# Patient Record
Sex: Male | Born: 1960 | Race: Black or African American | Hispanic: No | Marital: Married | Smoking: Never smoker
Health system: Southern US, Community
[De-identification: ages and names within clinical notes are randomized; demographics above are authoritative.]

## PROBLEM LIST (undated history)

## (undated) DIAGNOSIS — I10 Essential (primary) hypertension: Secondary | ICD-10-CM

## (undated) DIAGNOSIS — E785 Hyperlipidemia, unspecified: Secondary | ICD-10-CM

## (undated) DIAGNOSIS — F329 Major depressive disorder, single episode, unspecified: Secondary | ICD-10-CM

## (undated) DIAGNOSIS — G4730 Sleep apnea, unspecified: Secondary | ICD-10-CM

## (undated) DIAGNOSIS — F32A Depression, unspecified: Secondary | ICD-10-CM

## (undated) DIAGNOSIS — D696 Thrombocytopenia, unspecified: Secondary | ICD-10-CM

## (undated) HISTORY — DX: Thrombocytopenia, unspecified: D69.6

## (undated) HISTORY — DX: Essential (primary) hypertension: I10

## (undated) HISTORY — DX: Depression, unspecified: F32.A

## (undated) HISTORY — DX: Sleep apnea, unspecified: G47.30

## (undated) HISTORY — DX: Major depressive disorder, single episode, unspecified: F32.9

## (undated) HISTORY — DX: Hyperlipidemia, unspecified: E78.5

---

## 2006-02-24 ENCOUNTER — Emergency Department (HOSPITAL_COMMUNITY): Admission: EM | Admit: 2006-02-24 | Discharge: 2006-02-24 | Payer: Self-pay | Admitting: Emergency Medicine

## 2006-02-26 ENCOUNTER — Emergency Department (HOSPITAL_COMMUNITY): Admission: EM | Admit: 2006-02-26 | Discharge: 2006-02-26 | Payer: Self-pay | Admitting: Emergency Medicine

## 2009-10-03 ENCOUNTER — Emergency Department (HOSPITAL_COMMUNITY): Admission: EM | Admit: 2009-10-03 | Discharge: 2009-10-03 | Payer: Self-pay | Admitting: Emergency Medicine

## 2009-11-05 ENCOUNTER — Encounter: Admission: RE | Admit: 2009-11-05 | Discharge: 2009-11-05 | Payer: Self-pay | Admitting: Orthopedic Surgery

## 2011-10-02 ENCOUNTER — Telehealth: Payer: Self-pay | Admitting: Oncology

## 2011-10-02 NOTE — Telephone Encounter (Signed)
lmonvm adviisng the pt of an appt available week of 10/12/2011 and for him to call me to confirm

## 2011-10-02 NOTE — Telephone Encounter (Signed)
Pt clled in to confirm that he can make his appt on 10/12/2011 with dr Gaylyn Rong

## 2011-10-05 ENCOUNTER — Encounter: Payer: Self-pay | Admitting: Oncology

## 2011-10-05 ENCOUNTER — Other Ambulatory Visit: Payer: Self-pay | Admitting: Oncology

## 2011-10-05 DIAGNOSIS — M109 Gout, unspecified: Secondary | ICD-10-CM | POA: Insufficient documentation

## 2011-10-05 DIAGNOSIS — F32A Depression, unspecified: Secondary | ICD-10-CM | POA: Insufficient documentation

## 2011-10-05 DIAGNOSIS — G4730 Sleep apnea, unspecified: Secondary | ICD-10-CM | POA: Insufficient documentation

## 2011-10-05 DIAGNOSIS — D696 Thrombocytopenia, unspecified: Secondary | ICD-10-CM

## 2011-10-05 DIAGNOSIS — E785 Hyperlipidemia, unspecified: Secondary | ICD-10-CM | POA: Insufficient documentation

## 2011-10-05 DIAGNOSIS — F329 Major depressive disorder, single episode, unspecified: Secondary | ICD-10-CM | POA: Insufficient documentation

## 2011-10-05 DIAGNOSIS — I10 Essential (primary) hypertension: Secondary | ICD-10-CM | POA: Insufficient documentation

## 2011-10-12 ENCOUNTER — Ambulatory Visit: Payer: Self-pay

## 2011-10-12 ENCOUNTER — Ambulatory Visit: Payer: Self-pay | Admitting: Oncology

## 2011-10-12 ENCOUNTER — Other Ambulatory Visit: Payer: Self-pay | Admitting: Lab

## 2022-04-11 ENCOUNTER — Emergency Department (HOSPITAL_COMMUNITY): Payer: BC Managed Care – PPO

## 2022-04-11 ENCOUNTER — Emergency Department (HOSPITAL_COMMUNITY)
Admission: EM | Admit: 2022-04-11 | Discharge: 2022-04-11 | Disposition: A | Discharge status: Home / Self Care | Payer: BC Managed Care – PPO | Attending: Emergency Medicine | Admitting: Emergency Medicine

## 2022-04-11 ENCOUNTER — Encounter (HOSPITAL_COMMUNITY): Payer: Self-pay | Admitting: Emergency Medicine

## 2022-04-11 DIAGNOSIS — S93402A Sprain of unspecified ligament of left ankle, initial encounter: Secondary | ICD-10-CM | POA: Diagnosis not present

## 2022-04-11 DIAGNOSIS — W11XXXA Fall on and from ladder, initial encounter: Secondary | ICD-10-CM | POA: Diagnosis not present

## 2022-04-11 DIAGNOSIS — W19XXXA Unspecified fall, initial encounter: Secondary | ICD-10-CM

## 2022-04-11 DIAGNOSIS — M25512 Pain in left shoulder: Secondary | ICD-10-CM | POA: Diagnosis not present

## 2022-04-11 DIAGNOSIS — S9032XA Contusion of left foot, initial encounter: Secondary | ICD-10-CM | POA: Diagnosis not present

## 2022-04-11 DIAGNOSIS — R079 Chest pain, unspecified: Secondary | ICD-10-CM | POA: Diagnosis not present

## 2022-04-11 DIAGNOSIS — I10 Essential (primary) hypertension: Secondary | ICD-10-CM | POA: Insufficient documentation

## 2022-04-11 DIAGNOSIS — S99912A Unspecified injury of left ankle, initial encounter: Secondary | ICD-10-CM | POA: Diagnosis present

## 2022-04-11 NOTE — ED Provider Notes (Signed)
[facility] EMERGENCY DEPARTMENT Provider Note   CSN: [id number] Arrival date & time: 04/11/22  2052     History  Chief Complaint  Patient presents with   [name]    Bryan Morton is a 61 y.o. male.   Fall  Patient presents after fall.  [place] off a 12 foot ladder.  Initially she states there was some pain on left shoulder chest and left lower extremity.  States now really just hurts in his left ankle and knee.  Amatory since and states that happened about 2 hours ago and he kept working at [employer].  States he landed on some traumas and a speaker landed on to him.    Past Medical History:  Diagnosis Date   Depression    Gout    Hyperlipidemia    Hypertension    Sleep apnea    Thrombocytopenia ([place])     Home Medications Prior to Admission medications   Not on File      Allergies    Patient has no known allergies.    Review of Systems   Review of Systems  Physical Exam Updated Vital Signs BP 123/80   Pulse 80   Temp 98.7 F (37.1 C)   Resp (!) 23   Ht 6\' 1"  (1.854 m)   Wt 122.5 kg   SpO2 98%   BMI 35.62 kg/m  Physical Exam Vitals reviewed.  Constitutional:      Appearance: Normal appearance.  HENT:     Head: Atraumatic.  Eyes:     Extraocular Movements: Extraocular movements intact.  Cardiovascular:     Rate and Rhythm: Regular rhythm.  Pulmonary:     Effort: Pulmonary effort is normal.  Chest:     Chest wall: No tenderness.  Abdominal:     Tenderness: There is no abdominal tenderness.  Musculoskeletal:        General: Tenderness present.     Cervical back: Neck supple.     Comments: Tenderness over left ankle laterally.  Also tenderness over left calcaneus.  No deformity.  Good range of motion in knee.  Good range of motion ankle.  Sensation intact in foot.  No hip tenderness.  No spine tenderness.  Good range of motion left shoulder.  No clavicular tenderness.  No chest wall tenderness.  Skin:    Capillary Refill: Capillary refill takes  less than 2 seconds.  Neurological:     Mental Status: He is alert and oriented to person, place, and time.     ED Results / Procedures / Treatments   Labs (all labs ordered are listed, but only abnormal results are displayed) Labs Reviewed - No data to display  EKG None  Radiology DG Foot Complete Left  Result Date: 04/11/2022 CLINICAL DATA:  Fall, left foot pain EXAM: LEFT FOOT - COMPLETE 3+ VIEW COMPARISON:  None Available. FINDINGS: There is no evidence of fracture or dislocation. Small superior and plantar calcaneal spurs. There is no evidence of arthropathy or other focal bone abnormality. Soft tissues are unremarkable. IMPRESSION: No acute fracture or dislocation. Electronically Signed   By: Fidela Salisbury M.D.   On: 04/11/2022 21:48   DG Ankle Complete Left  Result Date: 04/11/2022 CLINICAL DATA:  Fall, left ankle pain EXAM: LEFT ANKLE COMPLETE - 3+ VIEW COMPARISON:  None Available. FINDINGS: Normal alignment. No acute fracture or dislocation. Ankle mortise is aligned. No ankle effusion. Mild soft tissue swelling superficial to the lateral malleolus. Small superior and plantar calcaneal spurs. IMPRESSION:  Lateral soft tissue swelling.  No acute fracture or dislocation. Electronically Signed   By: Fidela Salisbury M.D.   On: 04/11/2022 21:47    Procedures Procedures    Medications Ordered in ED Medications - No data to display  ED Course/ Medical Decision Making/ A&P                           Medical Decision Making Amount and/or Complexity of Data Reviewed Radiology: ordered.   Patient fell.  Initially had been complaining of some left shoulder pain but it had resolved.  Doubt severe intra-abdominal intrathoracic injury.  Not on blood thinners.  Does have some left ankle and left heel tenderness.  X-rays independently interpreted showed no fracture.  Discussed with the possibility of occult fracture.  Will give ASO and follow-up as needed.  Appears stable for  discharge.        Final Clinical Impression(s) / ED Diagnoses Final diagnoses:  Fall, initial encounter  Contusion of left foot, initial encounter  Sprain of left ankle, unspecified ligament, initial encounter    Rx / DC Orders ED Discharge Orders     None         Davonna Belling, MD 04/11/22 2329

## 2022-04-11 NOTE — ED Triage Notes (Signed)
Pt fell from 95ft ladder today. Pt c/o left shoulder, left ribs, and left ankle pain. Pt denies LOC.

## 2022-06-30 IMAGING — DX DG ANKLE COMPLETE 3+V*L*
3 series · 3 of 3 positions shown · non-contrast
Comparison: None Available.

CLINICAL DATA: Fall, left ankle pain

EXAM:
LEFT ANKLE COMPLETE - 3+ VIEW

[ankle ap]
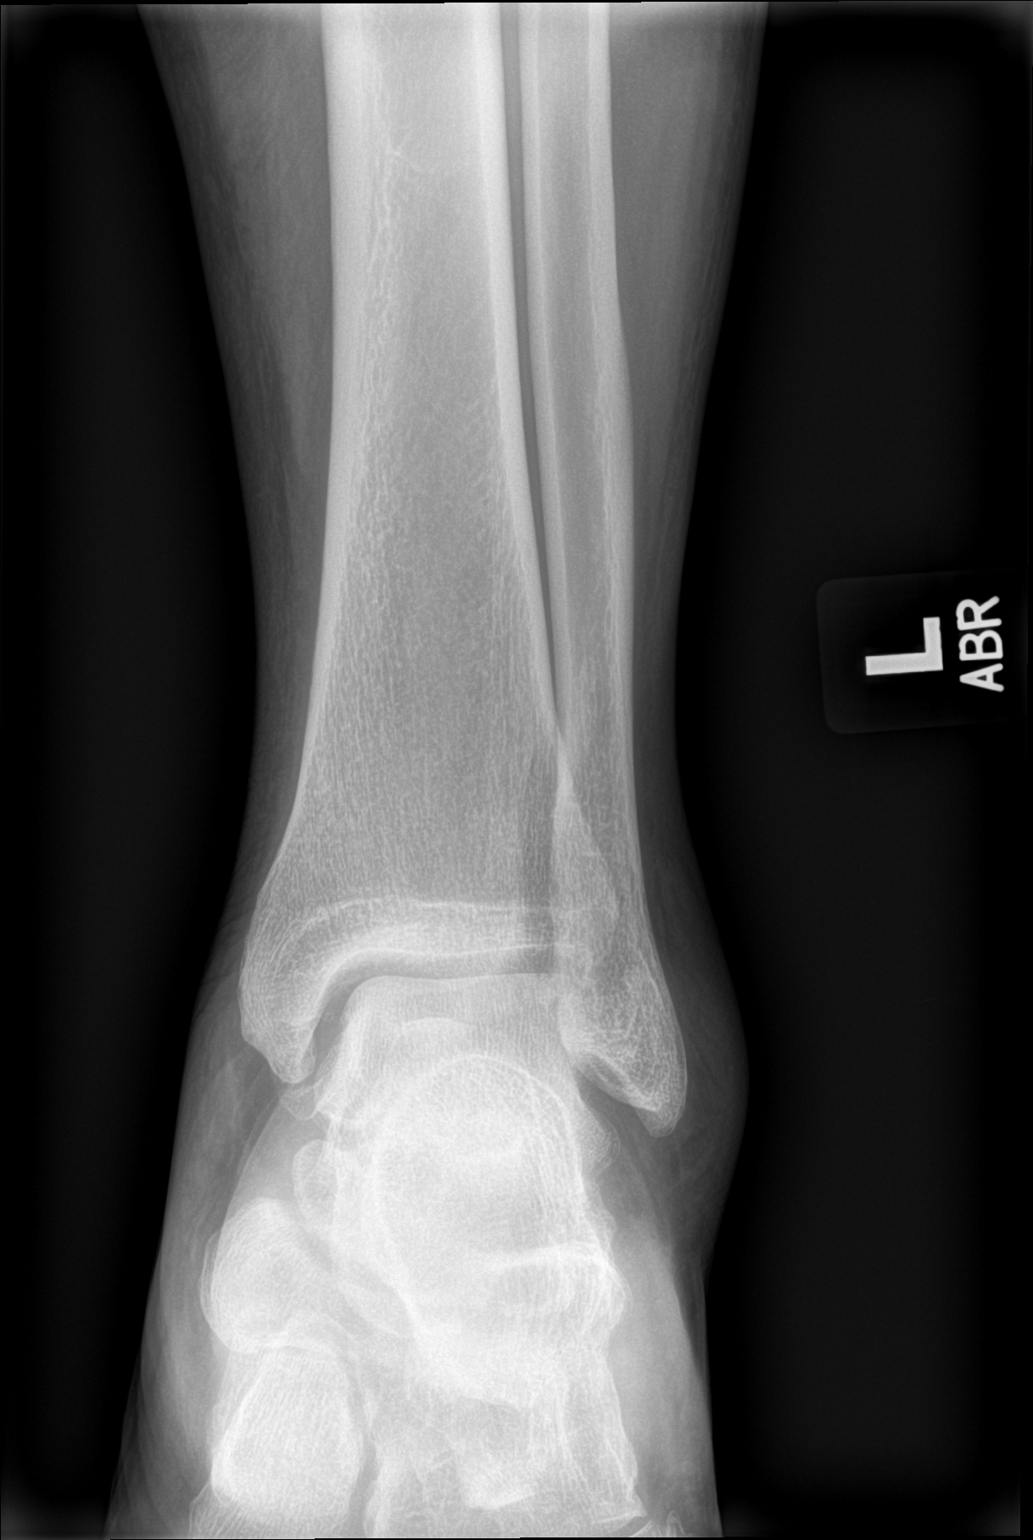

[ankle obl]
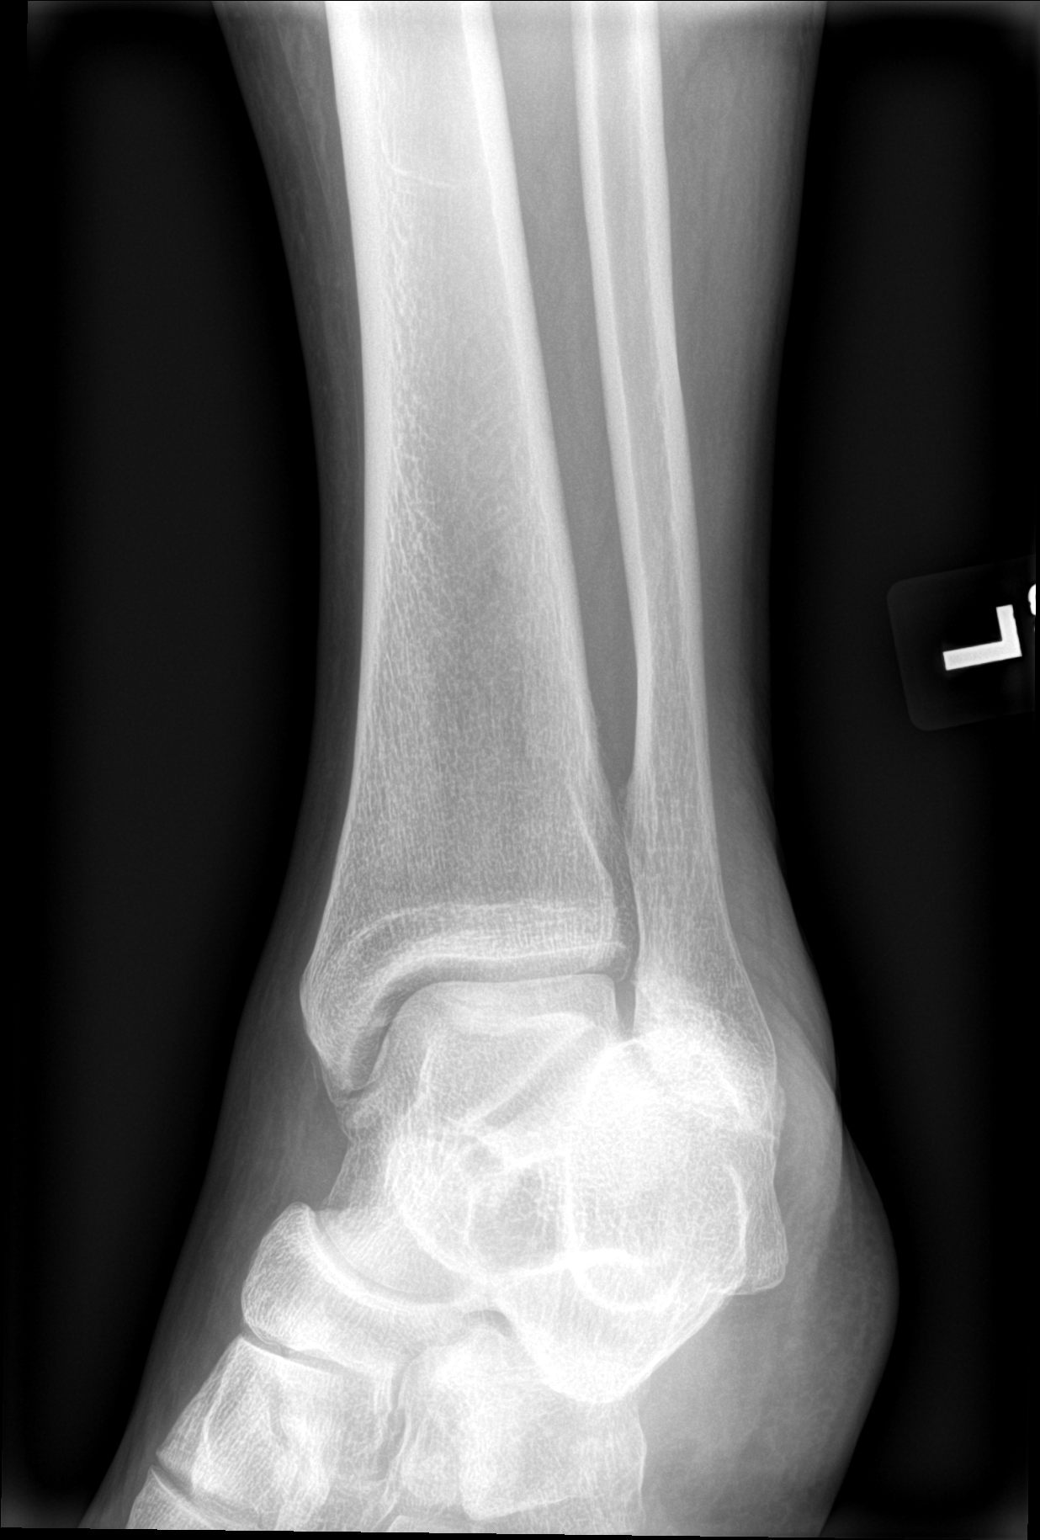

[ankle lat]
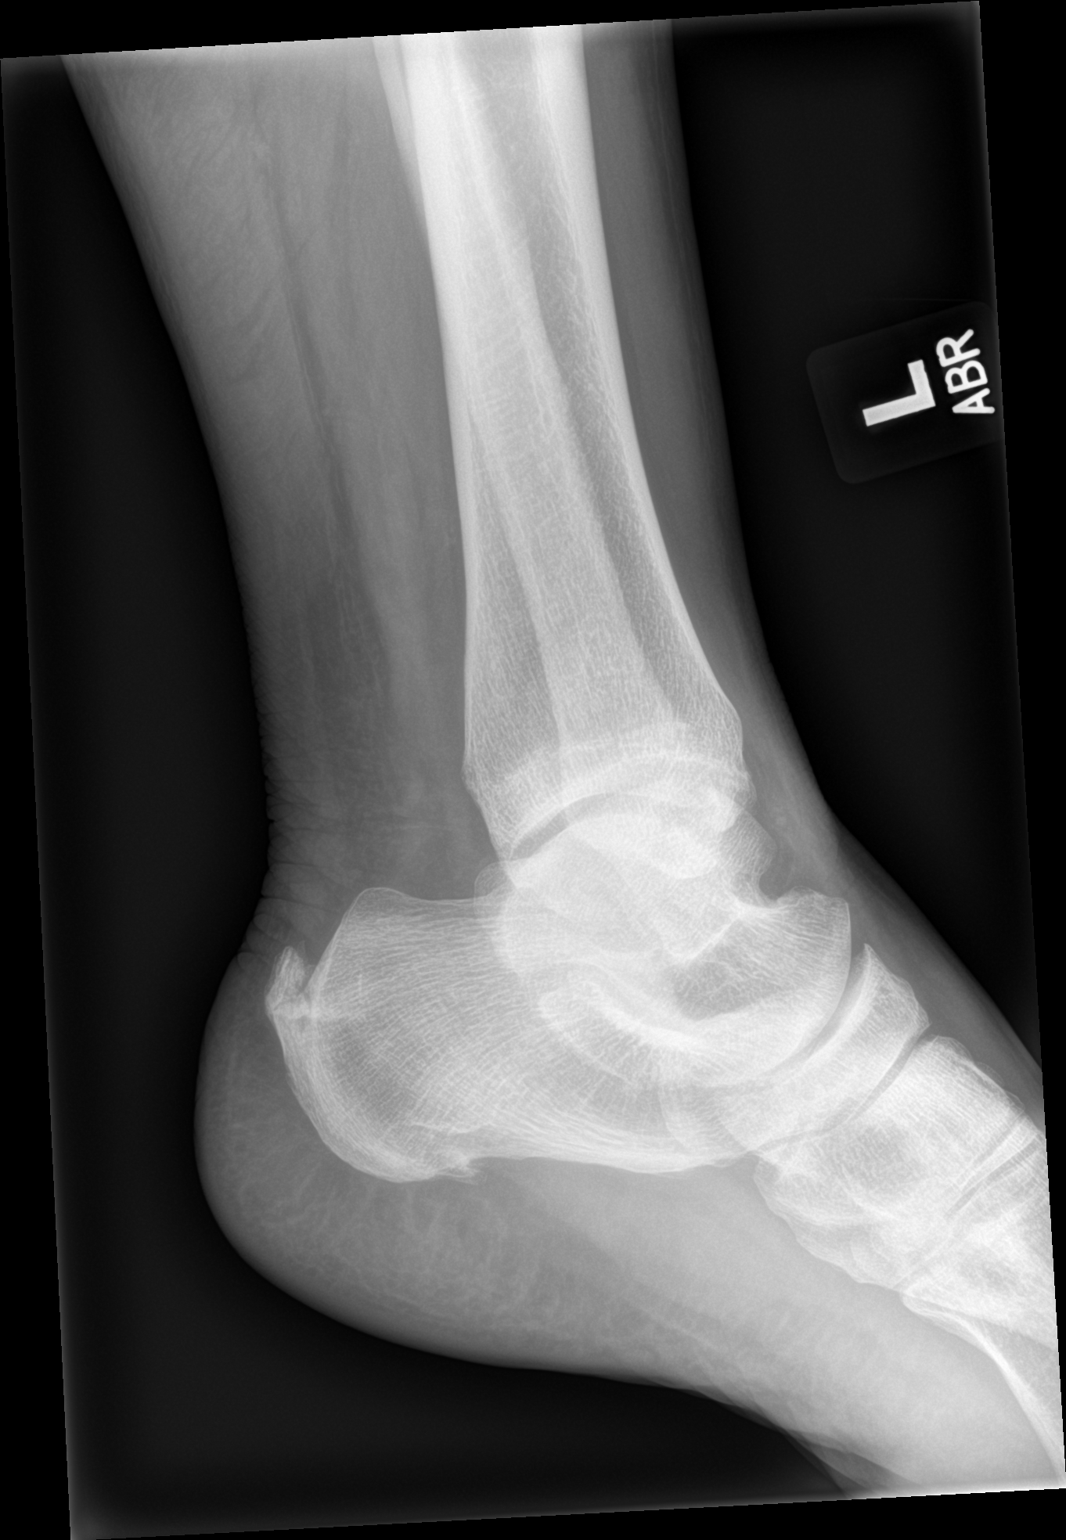

[3 of 3 positions shown; findings below may reference images not displayed]

FINDINGS: Normal alignment. No acute fracture or dislocation. Ankle mortise is
aligned. No ankle effusion. Mild soft tissue swelling superficial to
the lateral malleolus. Small superior and plantar calcaneal spurs.
IMPRESSION: Lateral soft tissue swelling.  No acute fracture or dislocation.

## 2022-06-30 IMAGING — DX DG FOOT COMPLETE 3+V*L*
3 series · 3 of 3 positions shown · non-contrast
Comparison: None Available.

CLINICAL DATA: Fall, left foot pain

EXAM:
LEFT FOOT - COMPLETE 3+ VIEW

[foot ap]
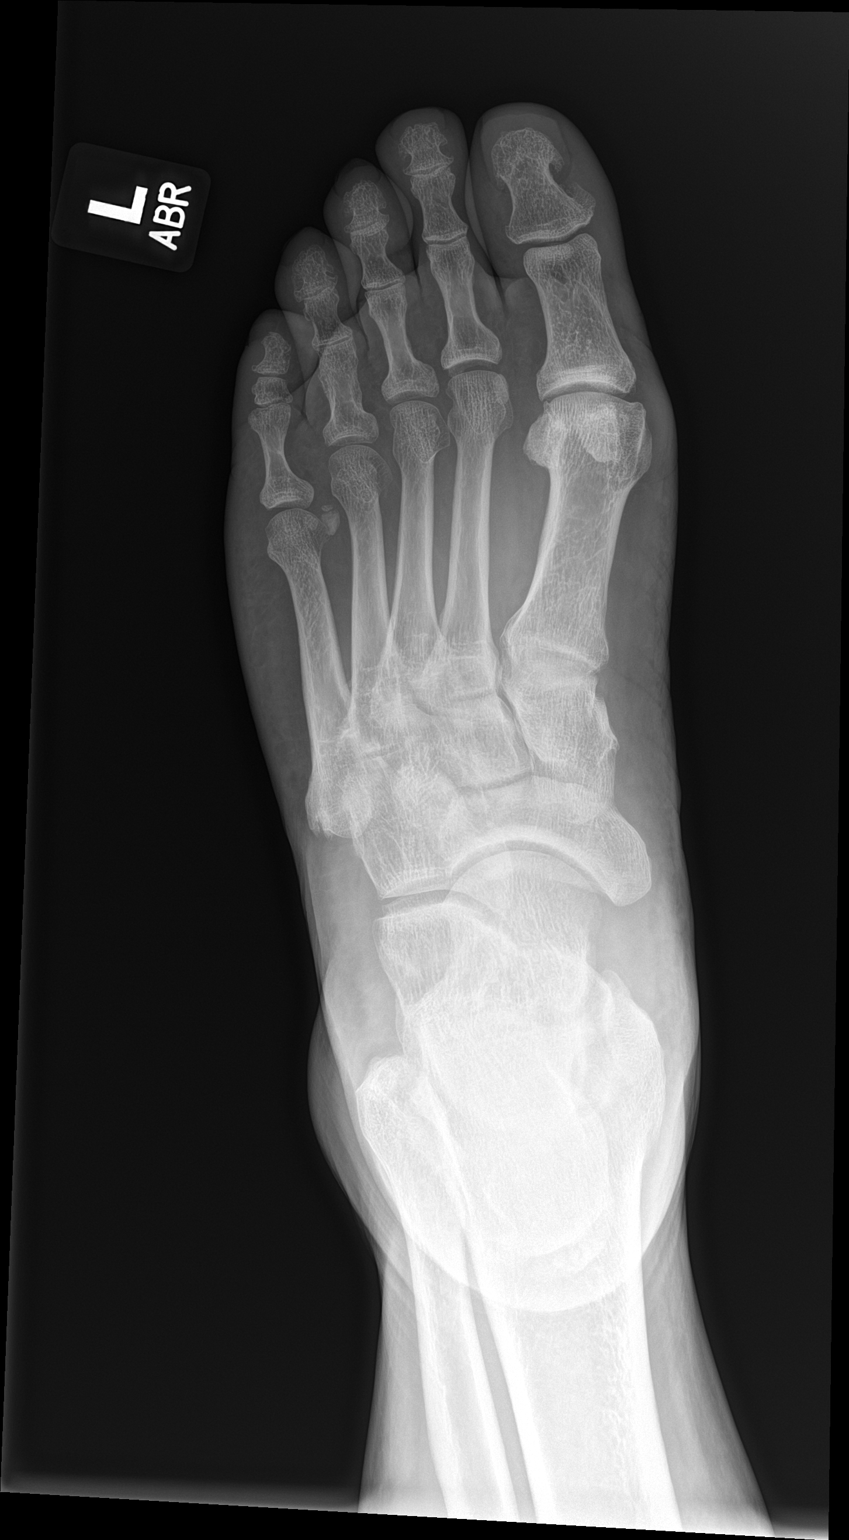

[foot obl]
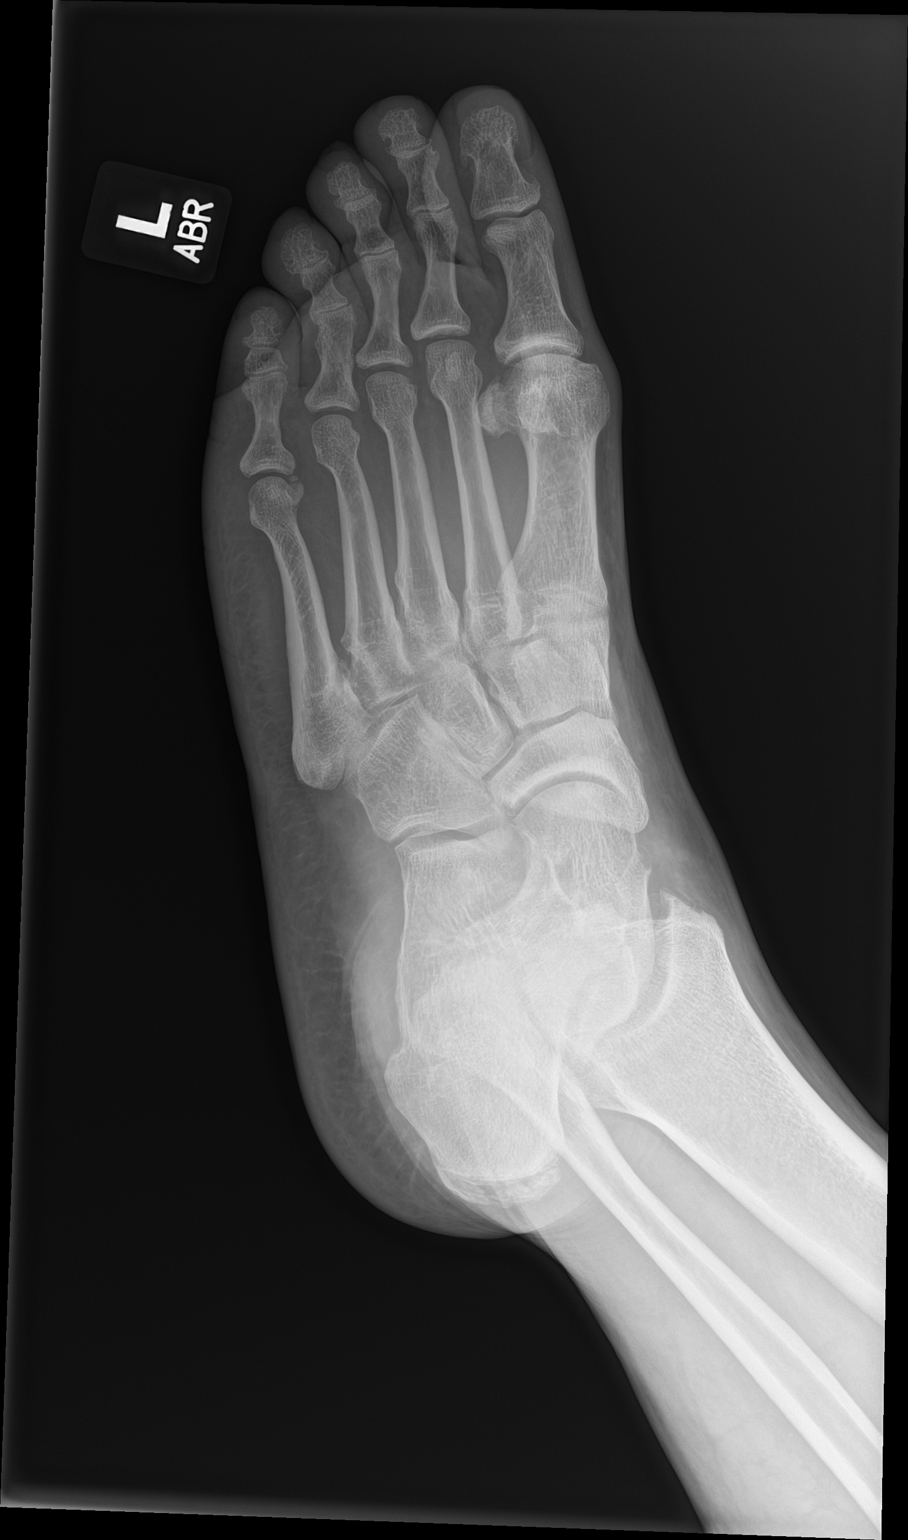

[foot lat]
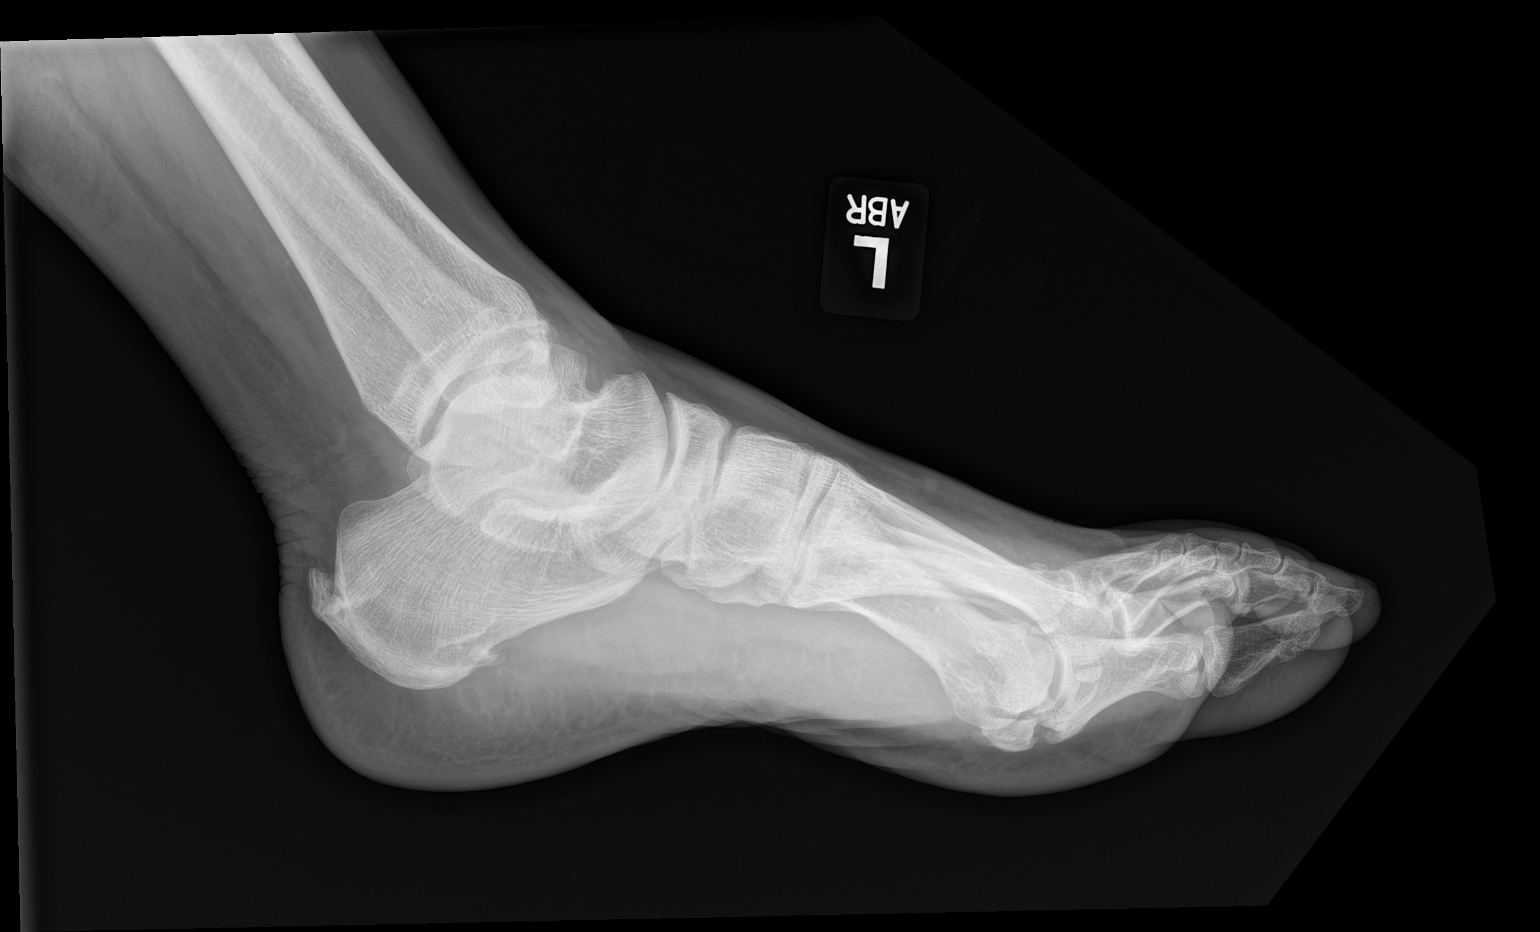

[3 of 3 positions shown; findings below may reference images not displayed]

FINDINGS: There is no evidence of fracture or dislocation. Small superior and
plantar calcaneal spurs. There is no evidence of arthropathy or
other focal bone abnormality. Soft tissues are unremarkable.
IMPRESSION: No acute fracture or dislocation.

## 2023-06-15 ENCOUNTER — Emergency Department (HOSPITAL_COMMUNITY)
Admission: EM | Admit: 2023-06-15 | Discharge: 2023-06-15 | Disposition: A | Discharge status: Home / Self Care | Payer: BC Managed Care – PPO | Attending: Emergency Medicine | Admitting: Emergency Medicine

## 2023-06-15 ENCOUNTER — Encounter (HOSPITAL_COMMUNITY): Payer: Self-pay | Admitting: *Deleted

## 2023-06-15 ENCOUNTER — Other Ambulatory Visit: Payer: Self-pay

## 2023-06-15 DIAGNOSIS — U071 COVID-19: Secondary | ICD-10-CM | POA: Insufficient documentation

## 2023-06-15 DIAGNOSIS — M791 Myalgia, unspecified site: Secondary | ICD-10-CM | POA: Diagnosis present

## 2023-06-15 DIAGNOSIS — I10 Essential (primary) hypertension: Secondary | ICD-10-CM | POA: Diagnosis not present

## 2023-06-15 LAB — SARS CORONAVIRUS 2 BY RT PCR: SARS Coronavirus 2 by RT PCR: POSITIVE — AB

## 2023-06-15 MED ORDER — PAXLOVID (300/100) 20 X 150 MG & 10 X 100MG PO TBPK: 3.0000 | ORAL_TABLET | Freq: Two times a day (BID) | ORAL | 0 refills | Status: AC

## 2023-06-15 MED ORDER — ACETAMINOPHEN 500 MG PO TABS
1000.0000 mg | ORAL_TABLET | Freq: Once | ORAL | Status: AC
Start: 2023-06-15 — End: 2023-06-15
  Administered 2023-06-15: 1000 mg via ORAL
  Filled 2023-06-15: qty 2

## 2023-06-15 NOTE — ED Triage Notes (Signed)
Pt with body aches since yesterday, fever earlier today and took tylenol at 0930 this morning.  + chills, + HA

## 2023-06-15 NOTE — ED Provider Notes (Signed)
[place] EMERGENCY DEPARTMENT AT [facility] Provider Note   CSN: [id number] Arrival date & time: 06/15/23  1648     History  Chief Complaint  Patient presents with   Generalized Body Aches    Bryan Morton is a 62 y.o. male history of hypertension, gout, hyperlipidemia presents today for evaluation of flulike symptoms.  Patient reports that he has had a fever and bodyaches since yesterday.  Also reports some cough.  Denies nausea, vomiting, chest pain, shortness of breath, bowel change, urinary symptoms.  No known sick contact.  Patient is up-to-date to his COVID vaccination.  HPI    Past Medical History:  Diagnosis Date   Depression    Gout    Hyperlipidemia    Hypertension    Sleep apnea    Thrombocytopenia ([facility])    History reviewed. No pertinent surgical history.   Home Medications Prior to Admission medications   Not on File      Allergies    Patient has no known allergies.    Review of Systems   Review of Systems Negative except as per HPI.  Physical Exam Updated Vital Signs BP (!) 127/59   Pulse 87   Temp 99.8 F (37.7 C) (Oral)   Resp 20   Ht 6\' 1"  (1.854 m)   Wt 131.5 kg   SpO2 96%   BMI 38.26 kg/m  Physical Exam Vitals and nursing note reviewed.  Constitutional:      Appearance: Normal appearance.  HENT:     Head: Normocephalic and atraumatic.     Mouth/Throat:     Mouth: Mucous membranes are moist.  Eyes:     General: No scleral icterus. Cardiovascular:     Rate and Rhythm: Normal rate and regular rhythm.     Pulses: Normal pulses.     Heart sounds: Normal heart sounds.  Pulmonary:     Effort: Pulmonary effort is normal.     Breath sounds: Normal breath sounds.  Abdominal:     General: Abdomen is flat.     Palpations: Abdomen is soft.     Tenderness: There is no abdominal tenderness.  Musculoskeletal:        General: No deformity.  Skin:    General: Skin is warm.     Findings: No rash.  Neurological:      General: No focal deficit present.     Mental Status: He is alert.  Psychiatric:        Mood and Affect: Mood normal.     ED Results / Procedures / Treatments   Labs (all labs ordered are listed, but only abnormal results are displayed) Labs Reviewed  SARS CORONAVIRUS 2 BY RT PCR - Abnormal; Notable for the following components:      Result Value   SARS Coronavirus 2 by RT PCR POSITIVE (*)    All other components within normal limits    EKG None  Radiology No results found.  Procedures Procedures    Medications Ordered in ED Medications - No data to display  ED Course/ Medical Decision Making/ A&P                                 Medical Decision Making  This patient presents to the ED for fever, body aches, this involves an extensive number of treatment options, and is a complaint that carries with a high risk of complications and morbidity.  The  differential diagnosis includes flu, COVID, RSV, strep, pharyngitis, bronchitis, pneumonia, infectious etiology.  This is not an exhaustive list.  Lab tests: I ordered and personally interpreted labs.  The pertinent results include: Viral panel positive for COVID.  Imaging studies:  Problem list/ ED course/ Critical interventions/ Medical management: HPI: See above Vital signs within normal range and stable throughout visit. Laboratory/imaging studies significant for: See above. On physical examination, patient is afebrile and appears in no acute distress. This patient presents with symptoms suspicious for COVID. Based on history and physical doubt sinusitis. COVID test was positive. Do not suspect underlying cardiopulmonary process. I considered, but think unlikely, pneumonia. Patient is nontoxic appearing and not in need of emergent medical intervention. Patient told to self isolate at home until symptoms subside for 72 hours. Recommended patient to take TheraFlu or Mucinex for symptom relief.  Follow-up with primary care  physician for further evaluation and management.  Return to the ER if new or worsening symptoms. I have reviewed the patient home medicines and have made adjustments as needed.  Cardiac monitoring/EKG: The patient was maintained on a cardiac monitor.  I personally reviewed and interpreted the cardiac monitor which showed an underlying rhythm of: sinus rhythm.  Additional history obtained: External records from outside source obtained and reviewed including: Chart review including previous notes, labs, imaging.  Consultations obtained:  Disposition Continued outpatient therapy. Follow-up with PCP recommended for reevaluation of symptoms. Treatment plan discussed with patient.  Pt acknowledged understanding was agreeable to the plan. Worrisome signs and symptoms were discussed with patient, and patient acknowledged understanding to return to the ED if they noticed these signs and symptoms. Patient was stable upon discharge.   This chart was dictated using voice recognition software.  Despite best efforts to proofread,  errors can occur which can change the documentation meaning.          Final Clinical Impression(s) / ED Diagnoses Final diagnoses:  COVID    Rx / DC Orders ED Discharge Orders          Ordered    nirmatrelvir & ritonavir (PAXLOVID, 300/100,) 20 x 150 MG & 10 x 100MG  TBPK  2 times daily        06/15/23 1919              Jeanelle Malling, [place] 06/15/23 [place], MD 06/17/23 2337

## 2023-06-15 NOTE — Discharge Instructions (Signed)
Please take your medications as prescribed. Take tylenol/ibuprofen for pain/fever.  You can also try TheraFlu or DayQuil/NyQuil or Mucinex for symptom relief. I recommend close follow-up with PCP for reevaluation.  Please do not hesitate to return to emergency department if worrisome signs symptoms we discussed become apparent.
